# Patient Record
Sex: Female | Born: 1985
Health system: Southern US, Community
[De-identification: ages and names within clinical notes are randomized; demographics above are authoritative.]

## PROBLEM LIST (undated history)

## (undated) DIAGNOSIS — I10 Essential (primary) hypertension: Secondary | ICD-10-CM

## (undated) HISTORY — PX: ANKLE SURGERY: SHX546

## (undated) HISTORY — DX: Essential (primary) hypertension: I10

---

## 2015-11-20 ENCOUNTER — Other Ambulatory Visit: Payer: Self-pay | Admitting: Family Medicine

## 2015-11-20 DIAGNOSIS — R1032 Left lower quadrant pain: Secondary | ICD-10-CM

## 2015-12-02 ENCOUNTER — Ambulatory Visit
Admission: RE | Admit: 2015-12-02 | Discharge: 2015-12-02 | Disposition: A | Discharge status: Home / Self Care | Payer: 59 | Source: Ambulatory Visit | Attending: Family Medicine | Admitting: Family Medicine

## 2015-12-02 DIAGNOSIS — R1032 Left lower quadrant pain: Secondary | ICD-10-CM

## 2016-12-30 DIAGNOSIS — D7589 Other specified diseases of blood and blood-forming organs: Secondary | ICD-10-CM | POA: Diagnosis not present

## 2017-01-11 DIAGNOSIS — M47819 Spondylosis without myelopathy or radiculopathy, site unspecified: Secondary | ICD-10-CM | POA: Diagnosis not present

## 2017-01-11 DIAGNOSIS — M47816 Spondylosis without myelopathy or radiculopathy, lumbar region: Secondary | ICD-10-CM | POA: Diagnosis not present

## 2017-08-20 DIAGNOSIS — I10 Essential (primary) hypertension: Secondary | ICD-10-CM | POA: Diagnosis not present

## 2018-03-01 DIAGNOSIS — I10 Essential (primary) hypertension: Secondary | ICD-10-CM | POA: Diagnosis not present

## 2018-11-01 DIAGNOSIS — I10 Essential (primary) hypertension: Secondary | ICD-10-CM | POA: Diagnosis not present

## 2019-10-17 DIAGNOSIS — D7589 Other specified diseases of blood and blood-forming organs: Secondary | ICD-10-CM | POA: Diagnosis not present

## 2019-10-17 DIAGNOSIS — F331 Major depressive disorder, recurrent, moderate: Secondary | ICD-10-CM | POA: Diagnosis not present

## 2019-10-17 DIAGNOSIS — F064 Anxiety disorder due to known physiological condition: Secondary | ICD-10-CM | POA: Diagnosis not present

## 2019-10-17 DIAGNOSIS — I10 Essential (primary) hypertension: Secondary | ICD-10-CM | POA: Diagnosis not present

## 2020-02-21 ENCOUNTER — Ambulatory Visit: Payer: BC Managed Care – PPO | Admitting: Orthopaedic Surgery

## 2020-02-21 ENCOUNTER — Ambulatory Visit: Payer: Self-pay

## 2020-02-21 ENCOUNTER — Encounter: Payer: Self-pay | Admitting: Orthopaedic Surgery

## 2020-02-21 VITALS — Ht 62.25 in | Wt 203.0 lb

## 2020-02-21 DIAGNOSIS — M25532 Pain in left wrist: Secondary | ICD-10-CM | POA: Diagnosis not present

## 2020-02-21 MED ORDER — MELOXICAM 7.5 MG PO TABS: 7.5000 mg | ORAL_TABLET | Freq: Two times a day (BID) | ORAL | 2 refills | Status: DC | PRN

## 2020-02-21 MED ORDER — PREDNISONE 10 MG (21) PO TBPK: ORAL_TABLET | ORAL | 0 refills | Status: DC

## 2020-02-21 NOTE — Progress Notes (Signed)
Office Visit Note   Patient: Alyssa Zavala           Date of Birth: Nov 05, 1985           MRN: [id number] Visit Date: 02/21/2020              Requested by: No referring provider defined for this encounter. PCP: Gweneth Dimitri, MD   Assessment & Plan: Visit Diagnoses:  1. Pain in left wrist     Plan: Impression is left wrist ECU tendinitis.  We will immobilized in a wrist brace for at least a couple weeks.  Prednisone Dosepak followed by meloxicam.  She will also do RICE.  Patient instructed to follow-up with she does not notice any improvement.  Follow-Up Instructions: Return if symptoms worsen or fail to improve.   Orders:  Orders Placed This Encounter  Procedures  . XR Wrist 2 Views Left   Meds ordered this encounter  Medications  . predniSONE (STERAPRED UNI-PAK 21 TAB) 10 MG (21) TBPK tablet    Sig: Take as directed    Dispense:  21 tablet    Refill:  0  . meloxicam (MOBIC) 7.5 MG tablet    Sig: Take 1 tablet (7.5 mg total) by mouth 2 (two) times daily as needed for pain.    Dispense:  30 tablet    Refill:  2      Procedures: No procedures performed   Clinical Data: No additional findings.   Subjective: Chief Complaint  Patient presents with  . Left Wrist - Pain, Numbness    Alyssa Zavala is a pleasant 34 year old female who comes in for evaluation of ulnar-sided left wrist pain for months without injury.  The pain is elicited by twisting of the hand and wrist extension and with pressure such as doing a push-up.  She denies any elbow symptoms.  Denies a history of rheumatoid arthritis.  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  Denies any constant numbness or tingling.   Review of Systems  Constitutional: Negative.   HENT: Negative.   Eyes: Negative.   Respiratory: Negative.   Cardiovascular: Negative.   Endocrine: Negative.   Musculoskeletal: Negative.   Neurological: Negative.   Hematological: Negative.    Psychiatric/Behavioral: Negative.   All other systems reviewed and are negative.    Objective: Vital Signs: Ht 5' 2.25" (1.581 m)   Wt 203 lb (92.1 kg)   BMI 36.83 kg/m   Physical Exam Vitals and nursing note reviewed.  Constitutional:      Appearance: She is well-developed.  HENT:     Head: Normocephalic and atraumatic.  Pulmonary:     Effort: Pulmonary effort is normal.  Abdominal:     Palpations: Abdomen is soft.  Musculoskeletal:     Cervical back: Neck supple.  Skin:    General: Skin is warm.     Capillary Refill: Capillary refill takes less than 2 seconds.  Neurological:     Mental Status: She is alert and oriented to person, place, and time.  Psychiatric:        Behavior: Behavior normal.        Thought Content: Thought content normal.        Judgment: Judgment normal.     Ortho Exam Left wrist shows no swelling or soft tissue crepitus.  She is tender over the ECU tendon with wrist supination and extension.  The tendon is stable.  Elbow exam is unremarkable.  Range of motion of the  wrist is normal. Specialty Comments:  No specialty comments available.  Imaging: XR Wrist 2 Views Left  Result Date: 02/21/2020  No acute or structural abnormalities    PMFS History: There are no problems to display for this patient.  No past medical history on file.  No family history on file.   Social History   Occupational History  . Not on file  Tobacco Use  . Smoking status: Former Smoker    Types: Cigarettes    Quit date: 09/18/2019    Years since quitting: 0.4  . Smokeless tobacco: Never Used  Vaping Use  . Vaping Use: Never used  Substance and Sexual Activity  . Alcohol use: Not on file  . Drug use: Not on file  . Sexual activity: Not on file

## 2020-03-29 DIAGNOSIS — Z20822 Contact with and (suspected) exposure to covid-19: Secondary | ICD-10-CM | POA: Diagnosis not present

## 2020-06-21 ENCOUNTER — Ambulatory Visit: Payer: BC Managed Care – PPO | Attending: Internal Medicine

## 2020-06-21 DIAGNOSIS — Z23 Encounter for immunization: Secondary | ICD-10-CM

## 2020-06-21 NOTE — Progress Notes (Signed)
   Covid-19 Vaccination Clinic  Name:  Alyssa Zavala    MRN: [id number] DOB: Jan 04, 1986  06/21/2020  [name] was observed post Covid-19 immunization for 15 minutes without incident. She was provided with Vaccine Information Sheet and instruction to access the V-Safe system.   [name] was instructed to call 911 with any severe reactions post vaccine: Marland Kitchen Difficulty breathing  . Swelling of face and throat  . A fast heartbeat  . A bad rash all over body  . Dizziness and weakness   Immunizations Administered    Name Date Dose VIS Date Route   Pfizer COVID-19 Vaccine 06/21/2020  2:37 PM 0.3 mL 05/08/2020 Intramuscular   Manufacturer: [employer], [employer]   Lot: [id number]   NDC: [address]-4

## 2020-08-19 DIAGNOSIS — Z13220 Encounter for screening for lipoid disorders: Secondary | ICD-10-CM | POA: Diagnosis not present

## 2020-08-19 DIAGNOSIS — D72829 Elevated white blood cell count, unspecified: Secondary | ICD-10-CM | POA: Diagnosis not present

## 2020-08-19 DIAGNOSIS — F1020 Alcohol dependence, uncomplicated: Secondary | ICD-10-CM | POA: Diagnosis not present

## 2020-08-19 DIAGNOSIS — I10 Essential (primary) hypertension: Secondary | ICD-10-CM | POA: Diagnosis not present

## 2020-08-19 DIAGNOSIS — M25532 Pain in left wrist: Secondary | ICD-10-CM | POA: Diagnosis not present

## 2020-08-19 DIAGNOSIS — F331 Major depressive disorder, recurrent, moderate: Secondary | ICD-10-CM | POA: Diagnosis not present

## 2020-12-24 ENCOUNTER — Other Ambulatory Visit: Payer: Self-pay

## 2020-12-24 ENCOUNTER — Ambulatory Visit: Payer: BC Managed Care – PPO | Admitting: Orthopaedic Surgery

## 2020-12-24 ENCOUNTER — Ambulatory Visit: Payer: Self-pay

## 2020-12-24 DIAGNOSIS — M25532 Pain in left wrist: Secondary | ICD-10-CM

## 2020-12-24 NOTE — Progress Notes (Signed)
   Office Visit Note   Patient: Alyssa Zavala           Date of Birth: [birth year]           MRN: [id number] Visit Date: 12/24/2020              Requested by: Gweneth Dimitri, MD [address] [place],  [place] [address] PCP: Gweneth Dimitri, MD   Assessment & Plan: Visit Diagnoses:  1. Pain in left wrist     Plan: Impression is left wrist questionable TFCC tear.  At this point, we will order an MR arthrogram to assess for structural abnormalities.  Follow-up with [place] once completed.  Follow-Up Instructions: Return for f/u after MR arthrogram left wrist.   Orders:  Orders Placed This Encounter  Procedures  . XR Wrist Complete Left   No orders of the defined types were placed in this encounter.     Procedures: No procedures performed   Clinical Data: No additional findings.   Subjective: Chief Complaint  Patient presents with  . Left Wrist - Pain    HPI patient is a pleasant 35 year old right-hand-dominant female who comes in today with recurrent left ulnar-sided wrist pain.  She was seen last year where she was diagnosed with ECU tendinitis.  She was initially prescribed a Velcro splint, steroid Dosepak followed by Mobic.  She notes that her symptoms did somewhat improve but have gradually returned over the past few months.  No new injury or change in activity.  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  All of her pain is to the distal ulna and over the TFCC.  Worse with flexion of the wrist, any sort of push-up motion as well as pressure to the wrist such as hitting it on a hard surface and most recently cold air.  No history of autoimmune disease.  Review of Systems as detailed in HPI.  All others reviewed and are negative.   Objective: Vital Signs: There were no vitals taken for this visit.  Physical Exam well-developed well-nourished female in no acute distress.  Alert oriented x3.  Ortho Exam left wrist exam shows mild tenderness along the ECU.   Moderate tenderness to the TFCC with pain when applying compression.  Increased pain with wrist flexion and extension.  She is neurovascular intact distally.  Specialty Comments:  No specialty comments available.  Imaging: No new imaging   PMFS History: There are no problems to display for this patient.  No past medical history on file.  No family history on file.   Social History   Occupational History  . Not on file  Tobacco Use  . Smoking status: Former Smoker    Types: Cigarettes    Quit date: 09/18/2019    Years since quitting: 1.2  . Smokeless tobacco: Never Used  Vaping Use  . Vaping Use: Never used  Substance and Sexual Activity  . Alcohol use: Not on file  . Drug use: Not on file  . Sexual activity: Not on file

## 2020-12-30 ENCOUNTER — Other Ambulatory Visit: Payer: Self-pay

## 2020-12-30 ENCOUNTER — Encounter: Payer: Self-pay | Admitting: Obstetrics and Gynecology

## 2020-12-30 ENCOUNTER — Ambulatory Visit: Payer: BC Managed Care – PPO | Admitting: Obstetrics and Gynecology

## 2020-12-30 VITALS — BP 140/82 | Ht 61.0 in | Wt 208.0 lb

## 2020-12-30 DIAGNOSIS — N926 Irregular menstruation, unspecified: Secondary | ICD-10-CM

## 2020-12-30 DIAGNOSIS — L290 Pruritus ani: Secondary | ICD-10-CM | POA: Diagnosis not present

## 2020-12-30 DIAGNOSIS — R58 Hemorrhage, not elsewhere classified: Secondary | ICD-10-CM

## 2020-12-30 LAB — PREGNANCY, URINE: Preg Test, Ur: NEGATIVE

## 2020-12-30 MED ORDER — CLOTRIMAZOLE 1 % EX CREA: 1.0000 "application " | TOPICAL_CREAM | Freq: Two times a day (BID) | CUTANEOUS | 0 refills | Status: AC

## 2020-12-30 NOTE — Progress Notes (Signed)
GYNECOLOGY  VISIT   HPI: 35 y.o.   Single  Caucasian  female   G1P0010 with No LMP recorded. (Menstrual status: Oral contraceptives).   here for irregular spotting x several weeks with oc's.   Taking POPs through her PCP, Dr. Marikay Alar. No menses in at least one year.  States she does not think that her bleeding is from the vagina.  Female partner for 10 years.   She notices blood with wiping for weeks. Had some stinging with wiping, which has resolved.  She has a labial lump.  She questions is has hidradenitis.  Had a UTI earlier this year. No dysuria.   Concerned about her urethra.   Can have bleeding from rectum.  This occurs once a month with excessive wiping.  Denies blood in stool or straining.   Prior patient of Shirlyn Goltz, FNP.  UPT - negative.   GYNECOLOGIC HISTORY: No LMP recorded. (Menstrual status: Oral contraceptives). Contraception: POP Menopausal hormone therapy:  none Last mammogram:  n/a Last pap smear: unsure when        OB History     Gravida  1   Para      Term      Preterm      AB  1   Living  0      SAB      IAB      Ectopic      Multiple      Live Births                 There are no problems to display for this patient.   Past Medical History:  Diagnosis Date   HTN (hypertension)     Past Surgical History:  Procedure Laterality Date   ANKLE SURGERY Right    PLATE AND SCREWS    Current Outpatient Medications  Medication Sig Dispense Refill   buPROPion (WELLBUTRIN XL) 300 MG 24 hr tablet Take 300 mg by mouth daily.     cetirizine (ZYRTEC) 10 MG tablet Take 10 mg by mouth daily.     DULoxetine (CYMBALTA) 60 MG capsule Take 60 mg by mouth daily.     lisinopril (ZESTRIL) 10 MG tablet Take 10 mg by mouth daily.     metoprolol tartrate (LOPRESSOR) 50 MG tablet Take 50 mg by mouth 2 (two) times daily.     norethindrone (MICRONOR) 0.35 MG tablet Take 1 tablet by mouth daily.     No current facility-administered  medications for this visit.     ALLERGIES: Cephalosporins and Penicillins  Family History  Problem Relation Age of Onset   Hypertension Mother    Hypertension Father    Diabetes Maternal Grandfather    Cancer Paternal Grandfather        BONE    Social History   Socioeconomic History   Marital status: Single    Spouse name: Not on file   Number of children: Not on file   Years of education: Not on file   Highest education level: Not on file  Occupational History   Not on file  Tobacco Use   Smoking status: Former    Pack years: 0.00    Types: Cigarettes    Quit date: 09/18/2019    Years since quitting: 1.2   Smokeless tobacco: Never  Vaping Use   Vaping Use: Never used  Substance and Sexual Activity   Alcohol use: Yes    Alcohol/week: 10.0 standard drinks    Types: 5 Glasses of  wine, 5 Shots of liquor per week   Drug use: Never   Sexual activity: Not Currently    Partners: Male    Birth control/protection: Pill  Other Topics Concern   Not on file  Social History Narrative   Not on file   Social Determinants of Health   Financial Resource Strain: Not on file  Food Insecurity: Not on file  Transportation Needs: Not on file  Physical Activity: Not on file  Stress: Not on file  Social Connections: Not on file  Intimate Partner Violence: Not on file    Review of Systems  Constitutional: Negative.   HENT: Negative.    Eyes: Negative.   Respiratory: Negative.    Endocrine: Negative.   Genitourinary:  Positive for menstrual problem and vaginal bleeding. Negative for vaginal pain.  Musculoskeletal: Negative.   Skin: Negative.   Allergic/Immunologic: Negative.   Neurological: Negative.   Hematological: Negative.   Psychiatric/Behavioral: Negative.     PHYSICAL EXAMINATION:    BP 140/82   Ht 5\' 1"  (1.549 m)   Wt 208 lb (94.3 kg)   SpO2 98%   BMI 39.30 kg/m     General appearance: alert, cooperative and appears stated age Head: Normocephalic, without  obvious abnormality, atraumatic Lungs: clear to auscultation bilaterally Heart: regular rate and rhythm Abdomen: soft, non-tender, no masses,  no organomegaly Extremities: extremities normal, atraumatic, no cyanosis or edema Skin: Skin color, texture, turgor normal. No rashes or lesions Lymph nodes: Cervical, supraclavicular, and axillary nodes normal. No abnormal inguinal nodes palpated Neurologic: Grossly normal  Pelvic: External genitalia:  7 mm subcutaneous lump in skin of left mons pubis.               Urethra:  normal appearing urethra with no masses, tenderness or lesions              Bartholins and Skenes: normal                 Vagina: normal appearing vagina with normal color and discharge, no lesions              Cervix: no lesions                Bimanual Exam:  Uterus:  normal size, contour, position, consistency, mobility, non-tender              Adnexa: no mass, fullness, tenderness              Rectal exam: Yes.  .  Confirms.              Anus:  normal sphincter tone, multiple perianal fissures and splits in skin.    Chaperone was present for exam.  ASSESSMENT  Bleeding from an unknown site.  I suspect this is likely from her anal region.  Anal irritation.  Probable vulvar lipoma or sebaceous cyst.   On Micronor.  HTN.  Obesity.  PLAN  Urinalysis and reflex culture.  Urine micro:  6 - 10 WBC, NS RBC, 0 - 5 RBC few bacteria. Lotrisone cream bid x 2 weeks.  Use aloe wipes.  Modify diet to reduce acidic and sugary choices.  Avoid irritants to skin.  Call if the lump on the subcutaneous area increases in size.  OK to continue Micronor.   We did discuss Mirena as an alternative to Micronor.  She will see her PCP for her routine well woman visit.   Fu prn.

## 2021-01-01 LAB — URINALYSIS, COMPLETE W/RFL CULTURE
Bilirubin Urine: NEGATIVE
Glucose, UA: NEGATIVE
Hyaline Cast: NONE SEEN /LPF
Ketones, ur: NEGATIVE
Nitrites, Initial: NEGATIVE
Protein, ur: NEGATIVE
RBC / HPF: NONE SEEN /HPF (ref 0–2)
Specific Gravity, Urine: 1.004 (ref 1.001–1.035)
pH: 6.5 (ref 5.0–8.0)

## 2021-01-01 LAB — URINE CULTURE
MICRO NUMBER:: 11999903
SPECIMEN QUALITY:: ADEQUATE

## 2021-01-01 LAB — CULTURE INDICATED

## 2021-01-06 ENCOUNTER — Ambulatory Visit: Payer: BC Managed Care – PPO | Admitting: Obstetrics and Gynecology

## 2021-01-07 ENCOUNTER — Ambulatory Visit
Admission: RE | Admit: 2021-01-07 | Discharge: 2021-01-07 | Disposition: A | Discharge status: Home / Self Care | Payer: BC Managed Care – PPO | Source: Ambulatory Visit | Attending: Orthopaedic Surgery | Admitting: Orthopaedic Surgery

## 2021-01-07 ENCOUNTER — Other Ambulatory Visit: Payer: Self-pay

## 2021-01-07 DIAGNOSIS — M65849 Other synovitis and tenosynovitis, unspecified hand: Secondary | ICD-10-CM | POA: Diagnosis not present

## 2021-01-07 DIAGNOSIS — M25532 Pain in left wrist: Secondary | ICD-10-CM | POA: Diagnosis not present

## 2021-01-07 MED ORDER — IOPAMIDOL (ISOVUE-M 200) INJECTION 41%
3.0000 mL | Freq: Once | INTRAMUSCULAR | Status: AC
  Administered 2021-01-07: 3 mL via INTRA_ARTICULAR

## 2021-01-14 ENCOUNTER — Encounter: Payer: Self-pay | Admitting: Orthopaedic Surgery

## 2021-01-14 ENCOUNTER — Ambulatory Visit: Payer: BC Managed Care – PPO | Admitting: Orthopaedic Surgery

## 2021-01-14 VITALS — Ht 61.0 in | Wt 208.0 lb

## 2021-01-14 DIAGNOSIS — M65832 Other synovitis and tenosynovitis, left forearm: Secondary | ICD-10-CM | POA: Diagnosis not present

## 2021-01-14 NOTE — Progress Notes (Signed)
   Office Visit Note   Patient: Alyssa Zavala           Date of Birth: Jan 03, 1986           MRN: [id number] Visit Date: 01/14/2021              Requested by: Gweneth Dimitri, MD [address] [place],  [place] [address] PCP: Gweneth Dimitri, MD   Assessment & Plan: Visit Diagnoses:  1. Extensor tenosynovitis of left wrist     Plan: MRI shows severe tendinosis and tenosynovitis of the ECU tendon near the ulnar styloid.  Otherwise MRI is unremarkable.  These findings were reviewed with the patient.  Overall she has had the symptoms for over a year without relief from immobilization and anti-inflammatories.  We had a discussion on treatment options and she will think about her options and let us know in the near future.  For now she will continue to wear the wrist brace for immobilization and support.  Follow-Up Instructions: No follow-ups on file.   Orders:  No orders of the defined types were placed in this encounter.  No orders of the defined types were placed in this encounter.     Procedures: No procedures performed   Clinical Data: No additional findings.   Subjective: Chief Complaint  Patient presents with   Left Wrist - Follow-up    MRI review    Alyssa Zavala returns today for MRI review of the left wrist.  Denies any changes hand or left wrist symptoms.  She has trouble pushing herself up off of the floor and lifting her niece due to the pain.   Review of Systems   Objective: Vital Signs: Ht 5\' 1"  (1.549 m)   Wt 208 lb (94.3 kg)   BMI 39.30 kg/m   Physical Exam  Ortho Exam Left wrist exam shows tenderness along the ECU tendon at the level of the wrist.  No soft tissue crepitus.  Slight decreased range of motion secondary to pain. Specialty Comments:  No specialty comments available.  Imaging: No results found.   PMFS History: There are no problems to display for this patient.  Past Medical History:  Diagnosis Date   HTN (hypertension)      Family History  Problem Relation Age of Onset   Hypertension Mother    Hypertension Father    Diabetes Maternal Grandfather    Cancer Paternal Grandfather        BONE    Past Surgical History:  Procedure Laterality Date   ANKLE SURGERY Right    PLATE AND SCREWS   Social History   Occupational History   Not on file  Tobacco Use   Smoking status: Former    Pack years: 0.00    Types: Cigarettes    Quit date: 09/18/2019    Years since quitting: 1.3   Smokeless tobacco: Never  Vaping Use   Vaping Use: Never used  Substance and Sexual Activity   Alcohol use: Yes    Alcohol/week: 10.0 standard drinks    Types: 5 Glasses of wine, 5 Shots of liquor per week   Drug use: Never   Sexual activity: Not Currently    Partners: Male    Birth control/protection: Pill

## 2021-01-17 ENCOUNTER — Telehealth: Payer: Self-pay | Admitting: Orthopaedic Surgery

## 2021-01-17 NOTE — Telephone Encounter (Signed)
Patient would like to move forward with left wrist surgery, but would like someone to answer a few questions.  She would like to know long the surgery would take and what type of anesthesia.  Also what is the down time for the procedure? 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     [phone]

## 2021-01-21 NOTE — Telephone Encounter (Signed)
Please advise 

## 2021-01-22 NOTE — Telephone Encounter (Signed)
Spoke to patient and answered her questions.  She wants to move forward with surgery.  I will give you a surgery she in the morning.  Thanks.

## 2021-02-04 ENCOUNTER — Other Ambulatory Visit: Payer: Self-pay | Admitting: Physician Assistant

## 2021-02-04 MED ORDER — ONDANSETRON HCL 4 MG PO TABS: 4.0000 mg | ORAL_TABLET | Freq: Three times a day (TID) | ORAL | 0 refills | Status: AC | PRN

## 2021-02-04 MED ORDER — HYDROCODONE-ACETAMINOPHEN 5-325 MG PO TABS: 1.0000 | ORAL_TABLET | Freq: Three times a day (TID) | ORAL | 0 refills | Status: AC | PRN

## 2021-02-06 ENCOUNTER — Encounter: Payer: Self-pay | Admitting: Orthopaedic Surgery

## 2021-02-06 DIAGNOSIS — M65832 Other synovitis and tenosynovitis, left forearm: Secondary | ICD-10-CM | POA: Diagnosis not present

## 2021-02-06 DIAGNOSIS — G8918 Other acute postprocedural pain: Secondary | ICD-10-CM | POA: Diagnosis not present

## 2021-02-11 DIAGNOSIS — M65832 Other synovitis and tenosynovitis, left forearm: Secondary | ICD-10-CM | POA: Diagnosis not present

## 2021-02-13 ENCOUNTER — Ambulatory Visit (INDEPENDENT_AMBULATORY_CARE_PROVIDER_SITE_OTHER): Payer: BC Managed Care – PPO | Admitting: Physician Assistant

## 2021-02-13 ENCOUNTER — Other Ambulatory Visit: Payer: Self-pay

## 2021-02-13 ENCOUNTER — Encounter: Payer: Self-pay | Admitting: Orthopaedic Surgery

## 2021-02-13 DIAGNOSIS — M65832 Other synovitis and tenosynovitis, left forearm: Secondary | ICD-10-CM

## 2021-02-13 MED ORDER — TRAMADOL HCL 50 MG PO TABS: 50.0000 mg | ORAL_TABLET | Freq: Four times a day (QID) | ORAL | 2 refills | Status: AC | PRN

## 2021-02-13 NOTE — Progress Notes (Signed)
   Post-Op Visit Note   Patient: Alyssa Zavala           Date of Birth: Mar 09, 1986           MRN: [id number] Visit Date: 02/13/2021 PCP: Gweneth Dimitri, MD   Assessment & Plan:  Chief Complaint:  Chief Complaint  Patient presents with   Left Wrist - Pain, Follow-up   Visit Diagnoses: No diagnosis found.  Plan: Patient is a very pleasant 35 year old female who comes in today 1 week out left wrist extensor tenolysis, date of surgery 02/06/2021.  She has been doing okay.  She has been compliant in her splint.  She has been in a fair amount of pain and has been taking Norco.  The Norco does not seem to help her pain but does make her very tired.  Examination of her left wrist reveals a well-healing surgical incision with nylon sutures in place.  No evidence of infection or cellulitis.  Fingers are warm and well-perfused.  Today, her wound was cleaned and recovered.  Velcro splint applied.  No heavy lifting or submerging her hand underwater.  She will follow-up with [place] next week for suture removal.  We did discuss changing her pain medication to oxycodone versus tramadol, but she would like to try tramadol for now as this will hopefully be less sedating.  Call with concerns or questions in the meantime.  Follow-Up Instructions: Return in about 1 week (around 02/20/2021).   Orders:  No orders of the defined types were placed in this encounter.  Meds ordered this encounter  Medications   traMADol (ULTRAM) 50 MG tablet    Sig: Take 1 tablet (50 mg total) by mouth every 6 (six) hours as needed.    Dispense:  60 tablet    Refill:  2    Imaging: No new imaging  PMFS History: Patient Active Problem List   Diagnosis Date Noted   Extensor tenosynovitis of left wrist 02/06/2021   Past Medical History:  Diagnosis Date   HTN (hypertension)     Family History  Problem Relation Age of Onset   Hypertension Mother    Hypertension Father    Diabetes Maternal Grandfather    Cancer  Paternal Grandfather        BONE    Past Surgical History:  Procedure Laterality Date   ANKLE SURGERY Right    PLATE AND SCREWS   Social History   Occupational History   Not on file  Tobacco Use   Smoking status: Former    Types: Cigarettes    Quit date: 09/18/2019    Years since quitting: 1.4   Smokeless tobacco: Never  Vaping Use   Vaping Use: Never used  Substance and Sexual Activity   Alcohol use: Yes    Alcohol/week: 10.0 standard drinks    Types: 5 Glasses of wine, 5 Shots of liquor per week   Drug use: Never   Sexual activity: Not Currently    Partners: Male    Birth control/protection: Pill

## 2021-02-20 ENCOUNTER — Encounter: Payer: Self-pay | Admitting: Orthopaedic Surgery

## 2021-02-20 NOTE — Telephone Encounter (Signed)
Emailed Ryan about this.  

## 2021-02-21 NOTE — Telephone Encounter (Signed)
Emailed it to [employer]

## 2021-02-25 ENCOUNTER — Other Ambulatory Visit: Payer: Self-pay

## 2021-02-25 ENCOUNTER — Encounter: Payer: Self-pay | Admitting: Orthopaedic Surgery

## 2021-02-25 ENCOUNTER — Ambulatory Visit (INDEPENDENT_AMBULATORY_CARE_PROVIDER_SITE_OTHER): Payer: BC Managed Care – PPO | Admitting: Orthopaedic Surgery

## 2021-02-25 DIAGNOSIS — M65832 Other synovitis and tenosynovitis, left forearm: Secondary | ICD-10-CM

## 2021-02-25 NOTE — Progress Notes (Signed)
   Post-Op Visit Note   Patient: Alyssa Zavala           Date of Birth: January 19, 1986           MRN: [id number] Visit Date: 02/25/2021 PCP: Gweneth Dimitri, MD   Assessment & Plan:  Chief Complaint:  Chief Complaint  Patient presents with   Left Wrist - Routine Post Op   Visit Diagnoses:  1. Extensor tenosynovitis of left wrist     Plan: Clear is 2-week status post left ECU tenosynovectomy.  Overall doing well.  Tramadol is effective.  Overall the pain is improving.  Left wrist surgical incision is healed.  No neurovascular compromise.  Gentle range of motion of the wrist is well-tolerated.  Minimal swelling.  Sutures removed today.  Continue immobilization with the Velcro wrist brace for another couple weeks.  I would like to get her into hand and wrist rehab at this point.  Recheck in 4 weeks.  Follow-Up Instructions: Return in about 4 weeks (around 03/25/2021).   Orders:  No orders of the defined types were placed in this encounter.  No orders of the defined types were placed in this encounter.   Imaging: No results found.  PMFS History: Patient Active Problem List   Diagnosis Date Noted   Extensor tenosynovitis of left wrist 02/06/2021   Past Medical History:  Diagnosis Date   HTN (hypertension)     Family History  Problem Relation Age of Onset   Hypertension Mother    Hypertension Father    Diabetes Maternal Grandfather    Cancer Paternal Grandfather        BONE    Past Surgical History:  Procedure Laterality Date   ANKLE SURGERY Right    PLATE AND SCREWS   Social History   Occupational History   Not on file  Tobacco Use   Smoking status: Former    Types: Cigarettes    Quit date: 09/18/2019    Years since quitting: 1.4   Smokeless tobacco: Never  Vaping Use   Vaping Use: Never used  Substance and Sexual Activity   Alcohol use: Yes    Alcohol/week: 10.0 standard drinks    Types: 5 Glasses of wine, 5 Shots of liquor per week   Drug use:  Never   Sexual activity: Not Currently    Partners: Male    Birth control/protection: Pill

## 2021-03-25 ENCOUNTER — Ambulatory Visit (INDEPENDENT_AMBULATORY_CARE_PROVIDER_SITE_OTHER): Payer: BC Managed Care – PPO | Admitting: Orthopaedic Surgery

## 2021-03-25 ENCOUNTER — Telehealth: Payer: Self-pay | Admitting: Orthopaedic Surgery

## 2021-03-25 ENCOUNTER — Other Ambulatory Visit: Payer: Self-pay

## 2021-03-25 DIAGNOSIS — M65832 Other synovitis and tenosynovitis, left forearm: Secondary | ICD-10-CM

## 2021-03-25 NOTE — Progress Notes (Signed)
   Post-Op Visit Note   Patient: Alyssa Zavala           Date of Birth: [birth year]           MRN: [id number] Visit Date: 03/25/2021 PCP: Gweneth Dimitri, MD   Assessment & Plan:  Chief Complaint:  Chief Complaint  Patient presents with   Left Wrist - Follow-up    02/06/2021 Left ECU tenosynovectomy   Visit Diagnoses:  1. Extensor tenosynovitis of left wrist     Plan: Alan Ripper returns today for 6-week follow-up status post left ECU tenolysis.  Overall doing well and just reports a little bit of scar tenderness and sensitivity.  She has returned to work without any problems.  Surgical scars fully healed.  Wrist range of motion is normal.  She has slight tenderness over the postsurgical scar.  Per my standpoint she has done well and do recommend going to therapy for scar desensitization.  Otherwise she is released to activity as tolerated.  We will see her back as needed.  Follow-Up Instructions: Return if symptoms worsen or fail to improve.   Orders:  No orders of the defined types were placed in this encounter.  No orders of the defined types were placed in this encounter.   Imaging: No results found.  PMFS History: Patient Active Problem List   Diagnosis Date Noted   Extensor tenosynovitis of left wrist 02/06/2021   Past Medical History:  Diagnosis Date   HTN (hypertension)     Family History  Problem Relation Age of Onset   Hypertension Mother    Hypertension Father    Diabetes Maternal Grandfather    Cancer Paternal Grandfather        BONE    Past Surgical History:  Procedure Laterality Date   ANKLE SURGERY Right    PLATE AND SCREWS   Social History   Occupational History   Not on file  Tobacco Use   Smoking status: Former    Types: Cigarettes    Quit date: 09/18/2019    Years since quitting: 1.5   Smokeless tobacco: Never  Vaping Use   Vaping Use: Never used  Substance and Sexual Activity   Alcohol use: Yes    Alcohol/week: 10.0 standard  drinks    Types: 5 Glasses of wine, 5 Shots of liquor per week   Drug use: Never   Sexual activity: Not Currently    Partners: Male    Birth control/protection: Pill

## 2021-03-25 NOTE — Telephone Encounter (Signed)
Baird Lyons from [facility] requesting orders be faxed for pt to have physical therapy. Please call Baird Lyons at [phone] if we have any question. Please fax orders too [phone].

## 2021-03-27 NOTE — Telephone Encounter (Signed)
FAXED

## 2021-03-31 ENCOUNTER — Telehealth: Payer: Self-pay

## 2021-03-31 NOTE — Telephone Encounter (Signed)
FAXED

## 2021-03-31 NOTE — Telephone Encounter (Signed)
[facility] would like patient's demographics faxed to [phone].  Cb# [phone].  Please advise.  Thank you.

## 2021-04-07 DIAGNOSIS — M65832 Other synovitis and tenosynovitis, left forearm: Secondary | ICD-10-CM | POA: Diagnosis not present

## 2021-04-07 DIAGNOSIS — M25532 Pain in left wrist: Secondary | ICD-10-CM | POA: Diagnosis not present

## 2021-04-07 DIAGNOSIS — R209 Unspecified disturbances of skin sensation: Secondary | ICD-10-CM | POA: Diagnosis not present

## 2021-05-05 DIAGNOSIS — H5213 Myopia, bilateral: Secondary | ICD-10-CM | POA: Diagnosis not present

## 2021-05-05 DIAGNOSIS — H16403 Unspecified corneal neovascularization, bilateral: Secondary | ICD-10-CM | POA: Diagnosis not present

## 2021-05-05 DIAGNOSIS — H52203 Unspecified astigmatism, bilateral: Secondary | ICD-10-CM | POA: Diagnosis not present

## 2021-07-07 DIAGNOSIS — L821 Other seborrheic keratosis: Secondary | ICD-10-CM | POA: Diagnosis not present

## 2021-07-07 DIAGNOSIS — D2261 Melanocytic nevi of right upper limb, including shoulder: Secondary | ICD-10-CM | POA: Diagnosis not present

## 2021-07-07 DIAGNOSIS — D225 Melanocytic nevi of trunk: Secondary | ICD-10-CM | POA: Diagnosis not present

## 2021-07-07 DIAGNOSIS — D1801 Hemangioma of skin and subcutaneous tissue: Secondary | ICD-10-CM | POA: Diagnosis not present

## 2021-08-05 DIAGNOSIS — D7589 Other specified diseases of blood and blood-forming organs: Secondary | ICD-10-CM | POA: Diagnosis not present

## 2021-08-05 DIAGNOSIS — I10 Essential (primary) hypertension: Secondary | ICD-10-CM | POA: Diagnosis not present

## 2021-08-05 DIAGNOSIS — Z23 Encounter for immunization: Secondary | ICD-10-CM | POA: Diagnosis not present

## 2021-08-05 DIAGNOSIS — R7301 Impaired fasting glucose: Secondary | ICD-10-CM | POA: Diagnosis not present

## 2021-08-05 DIAGNOSIS — F1020 Alcohol dependence, uncomplicated: Secondary | ICD-10-CM | POA: Diagnosis not present

## 2021-08-05 DIAGNOSIS — F331 Major depressive disorder, recurrent, moderate: Secondary | ICD-10-CM | POA: Diagnosis not present

## 2021-08-05 DIAGNOSIS — E785 Hyperlipidemia, unspecified: Secondary | ICD-10-CM | POA: Diagnosis not present

## 2023-01-06 DIAGNOSIS — I10 Essential (primary) hypertension: Secondary | ICD-10-CM | POA: Diagnosis not present

## 2023-01-06 DIAGNOSIS — F064 Anxiety disorder due to known physiological condition: Secondary | ICD-10-CM | POA: Diagnosis not present

## 2023-01-06 DIAGNOSIS — F1020 Alcohol dependence, uncomplicated: Secondary | ICD-10-CM | POA: Diagnosis not present

## 2023-01-06 DIAGNOSIS — F332 Major depressive disorder, recurrent severe without psychotic features: Secondary | ICD-10-CM | POA: Diagnosis not present

## 2023-01-15 DIAGNOSIS — H5213 Myopia, bilateral: Secondary | ICD-10-CM | POA: Diagnosis not present

## 2023-01-15 DIAGNOSIS — H16223 Keratoconjunctivitis sicca, not specified as Sjogren's, bilateral: Secondary | ICD-10-CM | POA: Diagnosis not present

## 2023-01-15 DIAGNOSIS — H01001 Unspecified blepharitis right upper eyelid: Secondary | ICD-10-CM | POA: Diagnosis not present

## 2023-01-15 DIAGNOSIS — H01004 Unspecified blepharitis left upper eyelid: Secondary | ICD-10-CM | POA: Diagnosis not present

## 2023-02-10 DIAGNOSIS — H16223 Keratoconjunctivitis sicca, not specified as Sjogren's, bilateral: Secondary | ICD-10-CM | POA: Diagnosis not present

## 2023-02-11 DIAGNOSIS — R300 Dysuria: Secondary | ICD-10-CM | POA: Diagnosis not present

## 2023-02-11 DIAGNOSIS — Z1159 Encounter for screening for other viral diseases: Secondary | ICD-10-CM | POA: Diagnosis not present

## 2023-02-11 DIAGNOSIS — D7589 Other specified diseases of blood and blood-forming organs: Secondary | ICD-10-CM | POA: Diagnosis not present

## 2023-02-11 DIAGNOSIS — E785 Hyperlipidemia, unspecified: Secondary | ICD-10-CM | POA: Diagnosis not present

## 2023-02-11 DIAGNOSIS — R946 Abnormal results of thyroid function studies: Secondary | ICD-10-CM | POA: Diagnosis not present

## 2023-02-11 DIAGNOSIS — N3001 Acute cystitis with hematuria: Secondary | ICD-10-CM | POA: Diagnosis not present

## 2023-02-11 DIAGNOSIS — I10 Essential (primary) hypertension: Secondary | ICD-10-CM | POA: Diagnosis not present

## 2023-02-11 DIAGNOSIS — R7301 Impaired fasting glucose: Secondary | ICD-10-CM | POA: Diagnosis not present

## 2023-02-11 DIAGNOSIS — F332 Major depressive disorder, recurrent severe without psychotic features: Secondary | ICD-10-CM | POA: Diagnosis not present

## 2023-02-17 DIAGNOSIS — F332 Major depressive disorder, recurrent severe without psychotic features: Secondary | ICD-10-CM | POA: Diagnosis not present

## 2023-03-20 DIAGNOSIS — F332 Major depressive disorder, recurrent severe without psychotic features: Secondary | ICD-10-CM | POA: Diagnosis not present

## 2023-04-19 DIAGNOSIS — F332 Major depressive disorder, recurrent severe without psychotic features: Secondary | ICD-10-CM | POA: Diagnosis not present

## 2023-05-20 DIAGNOSIS — F332 Major depressive disorder, recurrent severe without psychotic features: Secondary | ICD-10-CM | POA: Diagnosis not present

## 2023-06-02 IMAGING — MR MR WRIST*L* W/CM
6 series · 38 of 40 positions shown · IV contrast (agent unspecified)
Comparison: X-ray 12/24/2020

CLINICAL DATA: Ulnar-sided left wrist pain for 1 year

EXAM:
MRI OF THE LEFT WRIST WITH CONTRAST (MR Arthrogram)
TECHNIQUE: Multiplanar, multisequence MR imaging of the wrist was performed
immediately following contrast injection into the radiocarpal joint
under fluoroscopic guidance. No intravenous contrast was
administered.

[Series 4: T1 fat-sat · axial · 3.0mm · 0.20mm/px · z∈[-13,+55]mm · 7 of 23 slices shown (1 of 3)]
[im 1/23]
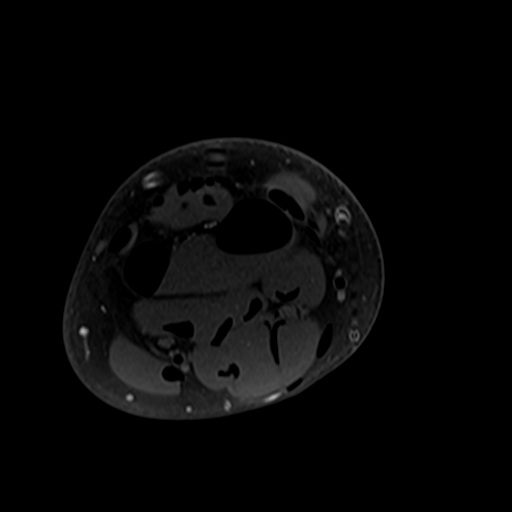
[im 4/23]
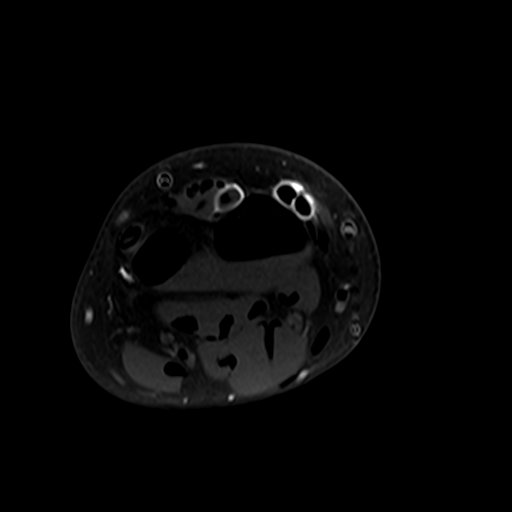
[im 8/23]
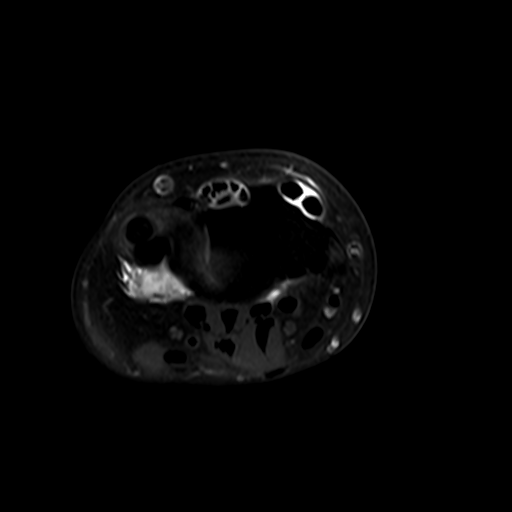
[im 12/23]
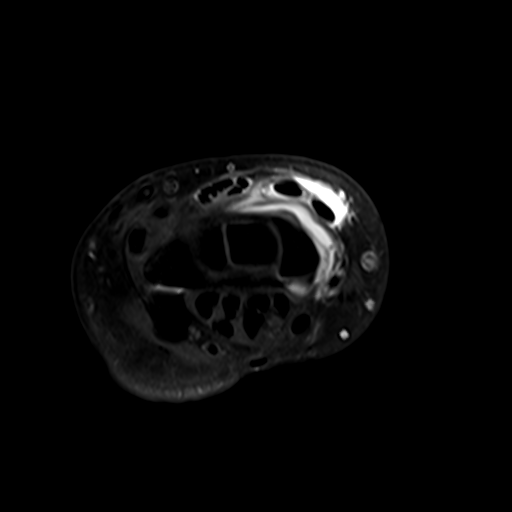
[im 15/23]
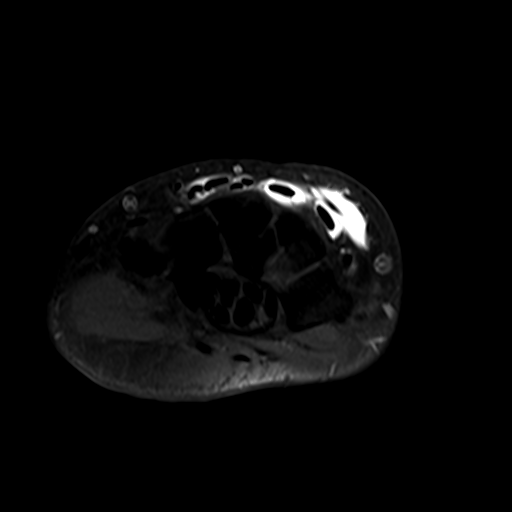
[im 19/23]
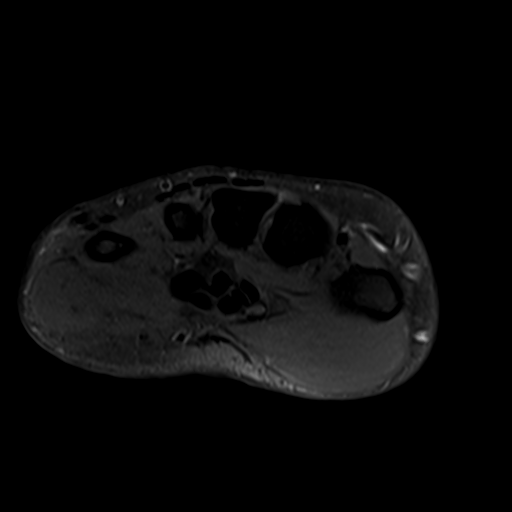
[im 23/23]
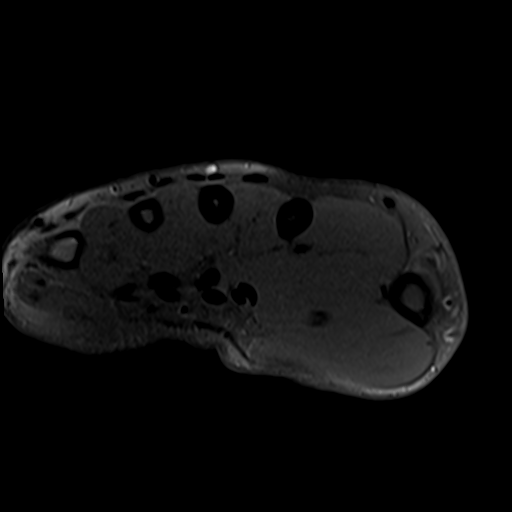

[Series 5: T2 fat-sat · axial · 3.0mm · 0.39mm/px · z∈[-13,+55]mm · 7 of 23 slices shown (1 of 3)]
[im 1/23]
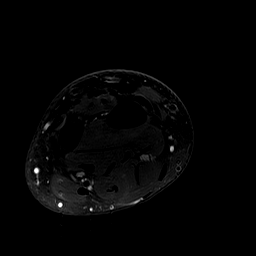
[im 4/23]
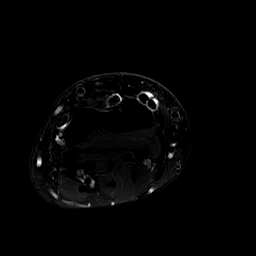
[im 8/23]
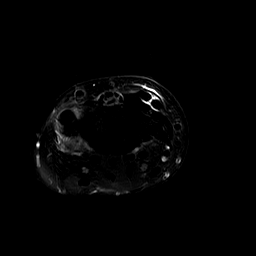
[im 12/23]
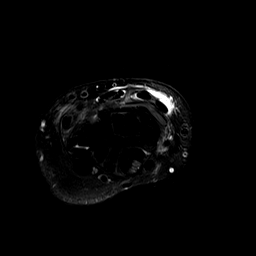
[im 15/23]
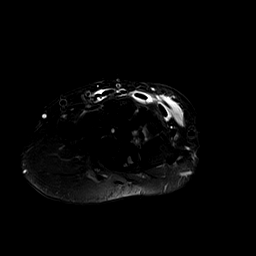
[im 19/23]
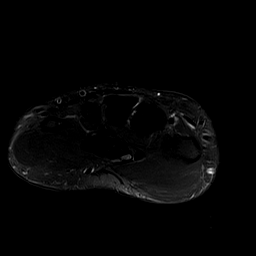
[im 23/23]
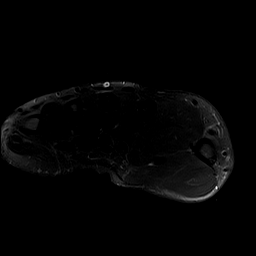

[Series 6: T1 fat-sat · coronal · 3.0mm · 0.31mm/px · 6 of 20 slices shown (2 of 3)]
[im 1/20]
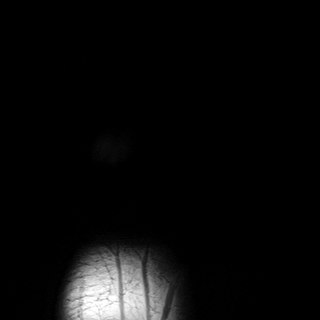
[im 4/20]
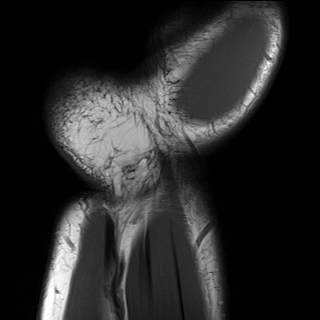
[im 8/20]
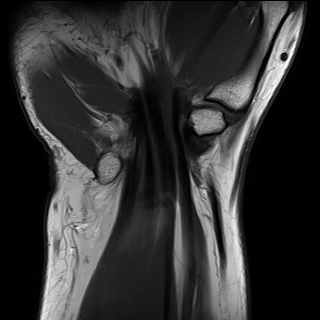
[im 12/20]
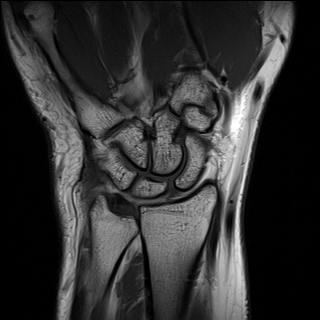
[im 16/20]
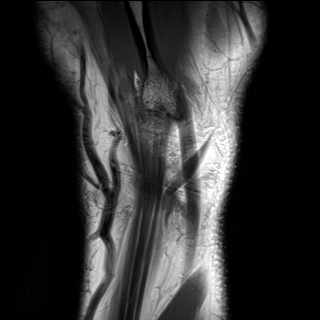
[im 20/20]
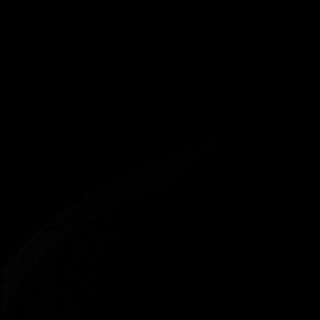

[Series 7: T1 fat-sat · coronal · 3.0mm · 0.20mm/px · 4 of 20 slices shown (3 of 3)]
[im 1/20]
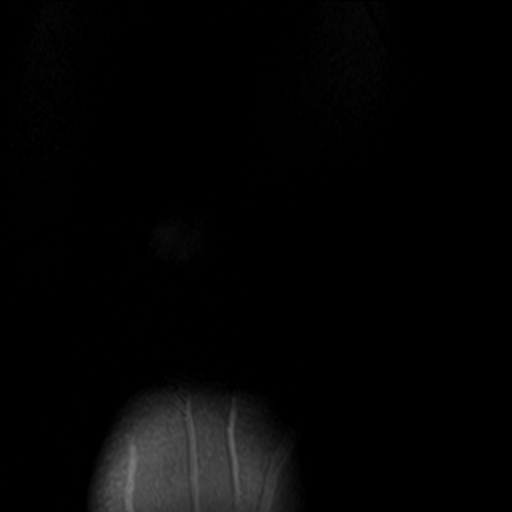
[im 4/20]
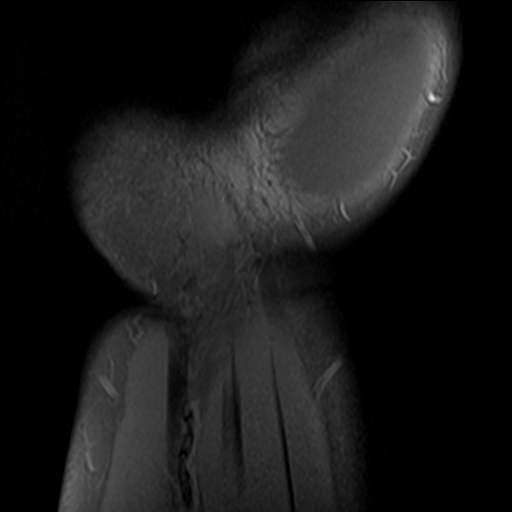
[im 8/20]
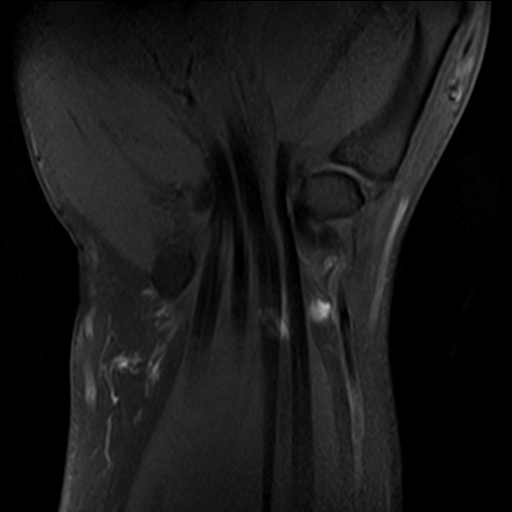
[im 12/20]
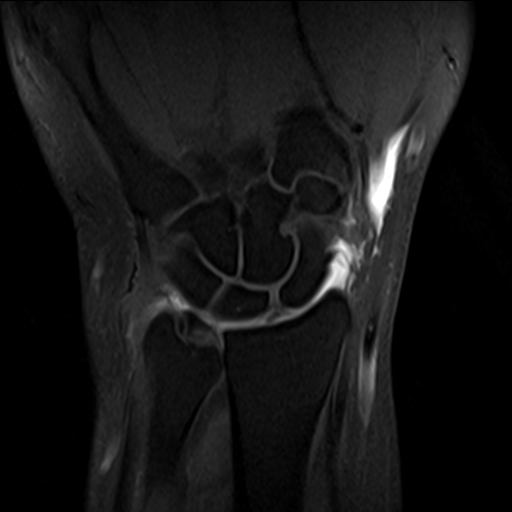

[Series 8: T2 fat-sat · coronal · 3.0mm · 0.39mm/px · 6 of 20 slices shown (2 of 3)]
[im 1/20]
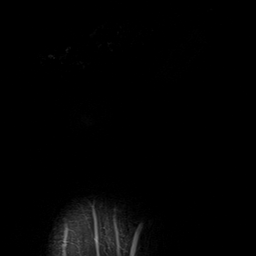
[im 4/20]
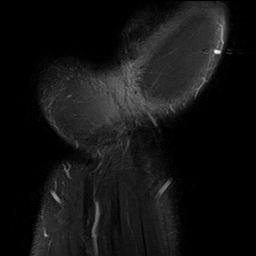
[im 8/20]
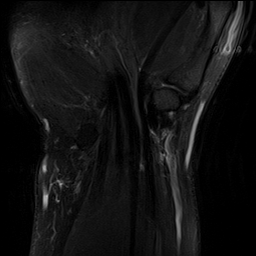
[im 12/20]
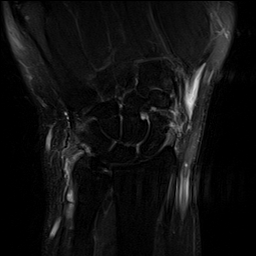
[im 16/20]
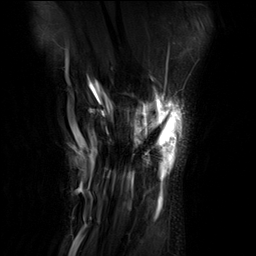
[im 20/20]
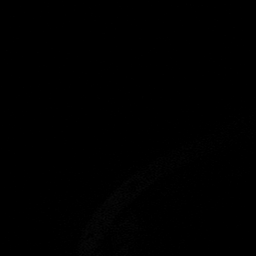

[Series 9: T2 fat-sat · sagittal · 3.0mm · 0.39mm/px · 8 of 26 slices shown (3 of 3)]
[im 1/26]
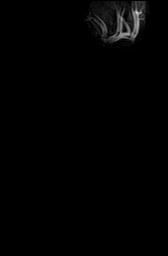
[im 4/26]
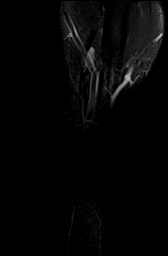
[im 8/26]
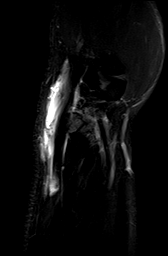
[im 11/26]
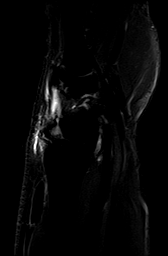
[im 15/26]
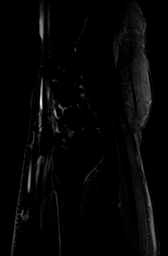
[im 18/26]
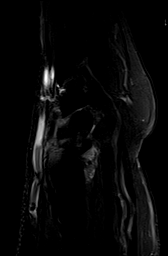
[im 22/26]
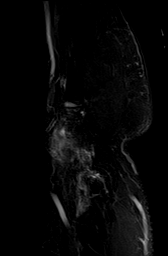
[im 26/26]
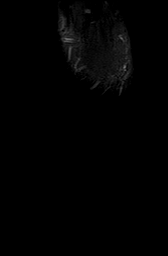

[38 of 40 positions shown; findings below may reference images not displayed]

FINDINGS: Ligaments: Intact scapholunate and lunotriquetral ligaments.

Triangular fibrocartilage: Intact TFCC. No contrast extends into the
distal radioulnar joint.

Tendons: Severe tendinosis with peritendinitis and mild
tenosynovitis of the extensor carpi ulnaris tendon at the level of
the ulnar styloid and wrist (series 5, image 14). Remaining flexor
and extensor tendons appear within normal limits. No tendon tear.
Injected contrast is incidentally noted within the second, third,
and fourth extensor compartments.

Carpal tunnel/median nerve: Carpal tunnel and median nerve within
normal limits.

Guyon's canal: Unremarkable.

Joint/cartilage: Radiocarpal joint is adequately distended with
injected contrast. No focal cartilage defect.

Bones/carpal alignment: No acute fracture. No malalignment. No
suspicious bone lesion.

Other: No cyst or mass about the wrist.
IMPRESSION: 1. Severe tendinosis with peritendinitis and mild tenosynovitis of
the extensor carpi ulnaris tendon.
2. Otherwise unremarkable MR arthrogram of the left wrist.

## 2023-06-19 DIAGNOSIS — F332 Major depressive disorder, recurrent severe without psychotic features: Secondary | ICD-10-CM | POA: Diagnosis not present

## 2023-06-21 DIAGNOSIS — F411 Generalized anxiety disorder: Secondary | ICD-10-CM | POA: Diagnosis not present

## 2023-07-08 DIAGNOSIS — H66003 Acute suppurative otitis media without spontaneous rupture of ear drum, bilateral: Secondary | ICD-10-CM | POA: Diagnosis not present

## 2023-07-08 DIAGNOSIS — J0100 Acute maxillary sinusitis, unspecified: Secondary | ICD-10-CM | POA: Diagnosis not present

## 2023-07-12 DIAGNOSIS — F411 Generalized anxiety disorder: Secondary | ICD-10-CM | POA: Diagnosis not present

## 2023-08-02 DIAGNOSIS — F411 Generalized anxiety disorder: Secondary | ICD-10-CM | POA: Diagnosis not present

## 2023-08-09 DIAGNOSIS — F411 Generalized anxiety disorder: Secondary | ICD-10-CM | POA: Diagnosis not present

## 2024-04-18 DIAGNOSIS — H16403 Unspecified corneal neovascularization, bilateral: Secondary | ICD-10-CM | POA: Diagnosis not present

## 2024-04-18 DIAGNOSIS — H5213 Myopia, bilateral: Secondary | ICD-10-CM | POA: Diagnosis not present

## 2024-04-26 DIAGNOSIS — E785 Hyperlipidemia, unspecified: Secondary | ICD-10-CM | POA: Diagnosis not present

## 2024-04-26 DIAGNOSIS — F331 Major depressive disorder, recurrent, moderate: Secondary | ICD-10-CM | POA: Diagnosis not present

## 2024-04-26 DIAGNOSIS — I10 Essential (primary) hypertension: Secondary | ICD-10-CM | POA: Diagnosis not present

## 2024-04-26 DIAGNOSIS — F064 Anxiety disorder due to known physiological condition: Secondary | ICD-10-CM | POA: Diagnosis not present

## 2024-04-26 DIAGNOSIS — D7589 Other specified diseases of blood and blood-forming organs: Secondary | ICD-10-CM | POA: Diagnosis not present

## 2024-05-01 DIAGNOSIS — F411 Generalized anxiety disorder: Secondary | ICD-10-CM | POA: Diagnosis not present

## 2024-05-08 DIAGNOSIS — F411 Generalized anxiety disorder: Secondary | ICD-10-CM | POA: Diagnosis not present

## 2024-05-09 DIAGNOSIS — F064 Anxiety disorder due to known physiological condition: Secondary | ICD-10-CM | POA: Diagnosis not present

## 2024-05-09 DIAGNOSIS — L290 Pruritus ani: Secondary | ICD-10-CM | POA: Diagnosis not present

## 2024-05-09 DIAGNOSIS — Z0001 Encounter for general adult medical examination with abnormal findings: Secondary | ICD-10-CM | POA: Diagnosis not present

## 2024-05-09 DIAGNOSIS — F331 Major depressive disorder, recurrent, moderate: Secondary | ICD-10-CM | POA: Diagnosis not present

## 2024-05-09 DIAGNOSIS — Z124 Encounter for screening for malignant neoplasm of cervix: Secondary | ICD-10-CM | POA: Diagnosis not present

## 2024-05-09 DIAGNOSIS — I10 Essential (primary) hypertension: Secondary | ICD-10-CM | POA: Diagnosis not present

## 2024-05-15 DIAGNOSIS — F411 Generalized anxiety disorder: Secondary | ICD-10-CM | POA: Diagnosis not present

## 2024-05-17 DIAGNOSIS — F411 Generalized anxiety disorder: Secondary | ICD-10-CM | POA: Diagnosis not present

## 2024-05-22 DIAGNOSIS — F411 Generalized anxiety disorder: Secondary | ICD-10-CM | POA: Diagnosis not present

## 2024-05-25 DIAGNOSIS — F411 Generalized anxiety disorder: Secondary | ICD-10-CM | POA: Diagnosis not present

## 2024-05-29 DIAGNOSIS — F411 Generalized anxiety disorder: Secondary | ICD-10-CM | POA: Diagnosis not present

## 2024-06-01 DIAGNOSIS — F411 Generalized anxiety disorder: Secondary | ICD-10-CM | POA: Diagnosis not present

## 2024-06-05 DIAGNOSIS — F411 Generalized anxiety disorder: Secondary | ICD-10-CM | POA: Diagnosis not present

## 2024-06-07 DIAGNOSIS — F331 Major depressive disorder, recurrent, moderate: Secondary | ICD-10-CM | POA: Diagnosis not present

## 2024-06-07 DIAGNOSIS — F064 Anxiety disorder due to known physiological condition: Secondary | ICD-10-CM | POA: Diagnosis not present

## 2024-06-07 DIAGNOSIS — F4010 Social phobia, unspecified: Secondary | ICD-10-CM | POA: Diagnosis not present

## 2024-06-07 DIAGNOSIS — Z6833 Body mass index (BMI) 33.0-33.9, adult: Secondary | ICD-10-CM | POA: Diagnosis not present

## 2024-06-07 DIAGNOSIS — R87810 Cervical high risk human papillomavirus (HPV) DNA test positive: Secondary | ICD-10-CM | POA: Diagnosis not present

## 2024-06-08 DIAGNOSIS — F411 Generalized anxiety disorder: Secondary | ICD-10-CM | POA: Diagnosis not present

## 2024-06-12 DIAGNOSIS — F411 Generalized anxiety disorder: Secondary | ICD-10-CM | POA: Diagnosis not present

## 2024-06-19 DIAGNOSIS — F411 Generalized anxiety disorder: Secondary | ICD-10-CM | POA: Diagnosis not present

## 2024-06-22 DIAGNOSIS — F411 Generalized anxiety disorder: Secondary | ICD-10-CM | POA: Diagnosis not present

## 2024-06-28 DIAGNOSIS — F331 Major depressive disorder, recurrent, moderate: Secondary | ICD-10-CM | POA: Diagnosis not present

## 2024-06-28 DIAGNOSIS — Z23 Encounter for immunization: Secondary | ICD-10-CM | POA: Diagnosis not present

## 2024-06-28 DIAGNOSIS — F064 Anxiety disorder due to known physiological condition: Secondary | ICD-10-CM | POA: Diagnosis not present
